# Patient Record
Sex: Female | Born: 2000 | Race: White | Hispanic: No | Marital: Single
Health system: Midwestern US, Community
[De-identification: ages and names within clinical notes are randomized; demographics above are authoritative.]

## PROBLEM LIST (undated history)

## (undated) DIAGNOSIS — N946 Dysmenorrhea, unspecified: Secondary | ICD-10-CM

## (undated) DIAGNOSIS — N912 Amenorrhea, unspecified: Secondary | ICD-10-CM

## (undated) DIAGNOSIS — S43491A Other sprain of right shoulder joint, initial encounter: Secondary | ICD-10-CM

## (undated) HISTORY — PX: TONSILLECTOMY: SUR1361

## (undated) HISTORY — PX: ADENOIDECTOMY: SUR15

## (undated) HISTORY — PX: TYMPANOSTOMY TUBE PLACEMENT: SHX32

---
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
## 2022-02-09 ENCOUNTER — Ambulatory Visit: Admit: 2022-02-09 | Discharge: 2022-02-09 | Payer: PRIVATE HEALTH INSURANCE

## 2022-02-09 DIAGNOSIS — M2602 Maxillary hypoplasia: Secondary | ICD-10-CM

## 2022-02-09 NOTE — Unmapped (Signed)
[facility]  Orthognathic Consult Note    Visit Type:  [facility] NPV ORTHOGNATHIC  Pt. Name: Sharon Christian  Pt. MRN: [id number]  DOB: 13-Nov-2000              Sex: female  Visit Date:  02/09/2022  Provider: Lelon Huh, DDS, MD  Resident: Cornelius Moras, DMD  Location of Care: [facility]    HPI:   No chief complaint on file.      HPI: Sharon Christian is a/an 21 y.o. female referred from Dr. Reola Calkins for evaluation for combined surgical and orthodontic correction of dentofacial deformities.    Orthognathic Surgery Questionnaire  Have you had braces before? Yes  If yes, how long ago, and for how long? Once in elementary school 2-3 years with headgear and another round in middle school for 2-3 years  Do you have braces currently? No  Have you completed your growth? Yes  What is your motivation for orthognathic surgery? improved function, improved esthetics, improved speech, and reduced discomfort  Do you frequently bite your cheeks, lips or tongue? Yes and Details:cheek  Do you have trouble biting into a sandwich? Yes  Do you have inability to incise and chew solid foods? Yes  Do you have trouble pronouncing/enunciating certain sounds? Yes   If yes, what sounds? S, SH, TH   If yes, have you ever been seen by a speech therapist? Yes when younger in elementary school  Do you snore? No  Is your facial deformity a result of a traumatic injury or a previous surgery? No  Do other members of your immediate or distant family have similar facial anomalies?  Yes and Details:paternal  How long have you been aware of the functional impairments? Since elementary school      Past Med/Surg/Family/Social History:  Depression/anxiety    Allergies:  - No known drug allergies  PMH:   - anxiety  - depression  - Possible EDS  PSH:  - R shoulder recon  - T&A  Meds:  Wellbutrin  Fluvoxamine  Minocycline   SH:  - Denies tobacco, EtOH, illicit drug use  FH:  -  Denies family history of bleeding disorders or difficulty with anesthesia    Objective:   Review of Systems: 10-point ROS completed and is negative except noted in HPI.  Vitals:    02/09/22 0938   BP: 120/82   Pulse: 70   Temp: 98.8 F (37.1 C)   SpO2: 99%     Body mass index is 26.16 kg/m.    Physical Exam  Facial exam  Facial shape: oblong  Facial profile: concave-class III     Dental midlines and occlusal cant  Maxillary midline to face: coincident  Mandibular midline to face: coincident  Occlusal plane at canine: parallel to orbital floor    Maxillary incisor position  Maxillary incisors in repose  0 mm  Maxillary incisor show at smile  10 mm  Maxillary central incisor length  10 mm      Gingival display  Gingival display at rest: teeth only  Gingival display at smile: teeth only    Maxillo-mandibular relationship  Occlusal class   Left molar:  Class III  Left canine:  Class III  Right molar:  Class III  Right canine:  Class III  Discrepancies  Open bite: -8 mm  Negative overjet: 13 mm  Transverse crossbite: bilateral     TMJ exam   Pain:  bilateral masseters, temporalis and TMJ capsule  CR-CO mismatch: none  Evidence of bruxism (describe): mild posterior bilateral     Third Molars:   1 - FBI  16 - STI  17 - Erupted  32 - Erupted    Airway  Thyromental distance: > 6 mm  Maximal incisal opening: > 40 mm  Tongue Size: normal  Mallampati Classification: I    Neck: no significant adenopathy, no scars, thyroid normal size  Chest/Respiratory: no gross deformities, no apparent respiratory distress   Cardiovascular: normal rate and rhythm   Neuro: cranial nerves grossly intact, sensation grossly intact, appropriate mental status  Psych: affect and mood appropriate, normal interaction    Radiographic Evaluation/Imaging:  Panorex:   Maxillary sinuses are equal in size and radiodensity. Mandibular condyles are well-formed and seated in the glenoid fossa.  No other radiographic evidence of maxillary or mandibular  pathology.     Cephalogram:   Radiograph reviewed, findings include: Profile - Convex. Cranial bases elevated with sella turcica of normal size and shape. Mandibular plane angle is Steep. Naso-pharyngeal and oro-pharyngeal airway appears to be normal. Please refer to detailed cephalometric analysis done separately for further details.      Assessment/Plan:     ASA Classification: 1    Criss Crall is a/an 21 y.o. female referred from Dr. Reola Calkins for evaluation for combined surgical and orthodontic correction of dentofacial deformities.    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   Discussed with patient/parent(s) the general nature of surgery, expectations of outcome, post operative sequalae, complications and further need for surgery.               [name], DMD  02/09/2022 9:48 AM  [facility]  [facility]  [address], [address]  [place] [address]  Dept: [phone]

## 2022-02-09 NOTE — Unmapped (Signed)
INSTRUCTIONS FOR IV SEDATION PATIENTS    Date of Procedure ________________________      Arrival Time _____________________________        AM Patients; NOTHING to eat or drink AFTER MIDNIGHT the night before your procedure. No food, water, gum, candy or cigarettes.  PM Patients; NOTHING to eat or drink AFTER 6:00AM the morning of your procedure.    Eating or drinking prior to your surgery could cause you to vomit while you are asleep, this could be life threatening.    1. Please REMOVE all jewelry, tongue/lip/nose rings, nail polish and contact lenses.    2. Please wear short sleeves and closed toe shoes, no flip flops.    3. You must have a responsible adult escort with you. They must remain in the building the entire time you are in surgery. Your surgery will be cancelled if your escort is not present during any of your appointment.    4. Your escort should be prepared to take you home by car or cab. You cannot go home on the bus and our facility does not provide transportation. You may park in the associated clinic lot for free. Please bring your ticket to the registration desk and exchange it for a token.    5. Minor patients ages 14-17-years old must be escorted by a parent or legal guardian. If none is present, the procedure will be cancelled. Minor patients ages 13-years old or younger must be escorted by two adults, one of whom is a legal guardian.    6. Children under the age of 14 are not allowed in the clinic area. ([facility] only)    7. If you need to cancel or reschedule, please call [phone].    SPECIAL INSTRUCTIONS:    Medications; Please take all of your normal morning medications with just a sip of water, unless instructed otherwise by your doctor.    If you have Asthma; please bring your inhaler to surgery.    If you have Diabetes; do NOT take your oral medication that morning. If you take insulin, ask the doctor for instructions.    Please limit the amount of people you bring due to limited space  in waiting area.

## 2022-05-13 NOTE — Unmapped (Signed)
Called to confirm details of 2/2 appt with [facility]. No answer, left VM.    Myeshia Fojtik M Narya Beavin, RN  Oral & Maxillofacial Surgery  [facility]

## 2022-05-14 ENCOUNTER — Ambulatory Visit: Admit: 2022-05-14 | Discharge: 2022-05-14 | Payer: Dental

## 2022-05-14 DIAGNOSIS — K006 Disturbances in tooth eruption: Secondary | ICD-10-CM

## 2022-05-14 MED ORDER — fentaNYL (SUBLIMAZE) injection
50 | INTRAMUSCULAR | PRN
Start: 2022-05-14 — End: 2022-05-14
  Administered 2022-05-14: 14:00:00 50 via INTRAVENOUS

## 2022-05-14 MED ORDER — propofol bolus injection
INTRAVENOUS | PRN
Start: 2022-05-14 — End: 2022-05-14
  Administered 2022-05-14 (×3): 20 via INTRAVENOUS

## 2022-05-14 MED ORDER — ibuprofen (MOTRIN) tablet
400 | ORAL | PRN
Start: 2022-05-14 — End: 2022-05-14
  Administered 2022-05-14: 14:00:00 800 via ORAL

## 2022-05-14 MED ORDER — acetaminophen (TYLENOL) 325 MG tablet
325 | ORAL_TABLET | Freq: Four times a day (QID) | ORAL | 0 refills | 11.00000 days | Status: AC | PRN
Start: 2022-05-14 — End: ?

## 2022-05-14 MED ORDER — sodium chloride 0.9% IV bolus
0.9 | INTRAVENOUS | PRN
Start: 2022-05-14 — End: 2022-05-14
  Administered 2022-05-14: 14:00:00 500 via INTRAVENOUS

## 2022-05-14 MED ORDER — ibuprofen (MOTRIN) 800 MG tablet
800 | ORAL_TABLET | Freq: Three times a day (TID) | ORAL | 0 refills | Status: AC | PRN
Start: 2022-05-14 — End: ?

## 2022-05-14 MED ORDER — midazolam (PF) (VERSED) injection
5 | INTRAMUSCULAR | PRN
Start: 2022-05-14 — End: 2022-05-14
  Administered 2022-05-14: 14:00:00 2 via INTRAVENOUS
  Administered 2022-05-14: 14:00:00 5 via INTRAVENOUS

## 2022-05-14 MED ORDER — NON FORMULARY
PRN
Start: 2022-05-14 — End: 2022-05-14
  Administered 2022-05-14: 14:00:00 20

## 2022-05-14 MED ORDER — oxyCODONE (ROXICODONE) 5 MG immediate release tablet
5 | ORAL_TABLET | Freq: Four times a day (QID) | ORAL | 0 refills | 6.00000 days | Status: AC | PRN
Start: 2022-05-14 — End: 2022-05-17

## 2022-05-14 MED ORDER — dexamethasone (DECADRON) injection
4 | INTRAMUSCULAR | PRN
Start: 2022-05-14 — End: 2022-05-14
  Administered 2022-05-14: 14:00:00 8 via INTRAVENOUS

## 2022-05-14 MED ORDER — chlorhexidine (PERIDEX) 0.12 % solution
0.12 | Freq: Two times a day (BID) | 0 refills | 16.00000 days | Status: AC
Start: 2022-05-14 — End: ?

## 2022-05-14 NOTE — Unmapped (Signed)
[facility]   Oral Maxillofacial Surgery      Pt. Name: Sharon Christian  Pt. MRN: [id number]  DOB: 2000/07/10            Sex: female  Provider: Lelon Huh, DDS, MD  Location of Care: [facility] at [facility]    Procedure: Third Molar Extractions #5,40,98,11  Date of Procedure: 05/14/2022     H&P/Consult within 30 days of procedure: No (no changes)    Informed Consent/Counseling Statement:  Plan, alternatives and risks of anesthesia, including death have been explained to and discussed with the patient/legal guardian.  By my assessment, the patient/legal guardian understands and agrees. Scenario presented in detail. Question answered.    Anesthesia: Deep Sedation  The patient presents NPO > 6 hours. Informed consent was performed. Time Out performed. Standard ASA monitors were instituted. Oxygen was delivered via nasal cannula. An IV catheter was placed and fluids began. See Sedation record for details. Reverification.    Gauze throat pack placed in oropharynx, bite block inserted.  Local anesthesia administered: 4 carpules of lidocaine 2% with 1:100k epi.                                                      2 carpules of marcaine 0.5% with 1:200k epi.     Tooth #1: Max Imp 3rd: A standard distobuccal releasing incision was made and a full thickness mucoperiosteal flap was reflected, bone was removed with a periosteal elevator, and the tooth was elevated and removed from its socket with a dental elevator. The flap and socket were suctioned and irrigated. Hemostasis was obtained and the area was packed.  Tooth #16: Simple erupted: A periosteal elevator was used to sever the periodontal fibers. The tooth was elevated and removed from its socket with dental elevators and forceps without complication, the socket was suctioned and irrigated.  Hemostasis was achieved and the area was packed.  Tooth #17 Simple erupted: A periosteal elevator was used to sever the periodontal fibers. The tooth was  elevated and removed from its socket with dental elevators and forceps without complication, the socket was suctioned and irrigated.  Hemostasis was achieved and the area was packed.  Tooth #32 Simple erupted: A periosteal elevator was used to sever the periodontal fibers. The tooth was elevated and removed from its socket with dental elevators and forceps without complication, the socket was suctioned and irrigated.  Hemostasis was achieved and the area was packed.    Thorough suctioning of the oropharynx, removal of throat pack and bite block    Complications/Abnormal findings: N/A  Estimated Blood Loss: Minimal  Patient tolerated anesthesia and procedure well.    Rx:Rx:Oxycodone 5mg  x8 tabs  Ibuprofen 800mg  x 30 tabs  Tylenol 325mg  x 60 tabs  Peridex    Postoperative instructions given both verbally and written. Extra gauze packs given to patient.  Disposition: Patient to follow up on an as needed basis    Signed by: Acey Lav  Date: 05/14/22   Time: 2:42 PM

## 2022-05-14 NOTE — Unmapped (Signed)
Oral and Maxillofacial Surgery  Orthognathic Consult Note    Visit Type:  SEDATION  Pt. Name: Sharon Christian  Pt. MRN: [id number]  DOB: 2000/12/20              Sex: female  Visit Date:  05/14/2022  Provider: Ashley Akin, DDS, MD  Location of Care:  Oral and Maxillofacial Surgery at [facility]    HPI:         HPI: Sharon Christian is a/an 22 y.o. female who returns today for repeat orthognathic evaluation and extraction of 3rd molars. She was initially referred by Dr. Ferdinand Cava but is now seeing Dr. Evalee Mutton. I spoke to Dr. Evalee Mutton and he would like to explore the possibility of surgery first for her as he feels she has a favorable occlusion for this. The patient has a significant class III malocclusion.     Orthognathic Surgery Questionnaire  Have you had braces before? Yes  If yes, how long ago, and for how long? Once in elementary school 2-3 years with headgear and another round in middle school for 2-3 years  Do you have braces currently? No  Have you completed your growth? Yes  What is your motivation for orthognathic surgery? improved function, improved esthetics, improved speech, and reduced discomfort  Do you frequently bite your cheeks, lips or tongue? Yes and Details:cheek  Do you have trouble biting into a sandwich? Yes  Do you have inability to incise and chew solid foods? Yes  Do you have trouble pronouncing/enunciating certain sounds? Yes   If yes, what sounds? S, SH, TH   If yes, have you ever been seen by a speech therapist? Yes when younger in elementary school  Do you snore? No  Is your facial deformity a result of a traumatic injury or a previous surgery? No  Do other members of your immediate or distant family have similar facial anomalies?  Yes and Details:paternal  (dad and grandpa)  How long have you been aware of the functional impairments? Since elementary school      Past Med/Surg/Family/Social History:  Depression/anxiety    Allergies:  - No known drug allergies  PMH:   -  anxiety  - depression  - Possible EDS  PSH:  - R shoulder recon  - T&A  Meds:  Wellbutrin  Fluvoxamine  Minocycline   SH:  - Denies tobacco, EtOH, illicit drug use  FH:  - Denies family history of bleeding disorders or difficulty with anesthesia    Objective:   Review of Systems: 10-point ROS completed and is negative except noted in HPI.  Vitals:    05/14/22 0910 05/14/22 0915   BP: 122/70 115/69   Pulse: 97 81   Resp: 16 14   SpO2:       There is no height or weight on file to calculate BMI.    Physical Exam  Facial exam  Facial shape: oblong  Facial profile: concave-class III     Dental midlines and occlusal cant  Maxillary midline to face: 80mm left  Mandibular midline to face: 67mm left  Occlusal plane at canine: parallel to orbital floor    Maxillary incisor position  Maxillary incisors in repose  0 mm  Maxillary incisor show at smile  10 mm  Maxillary central incisor length  10 mm      Gingival display  Gingival display at rest: teeth only  Gingival display at smile: teeth only    Maxillo-mandibular relationship  Occlusal class  Left molar:  Class III  Left canine:  Class III  Right molar:  Class III  Right canine:  Class III  Discrepancies  Open bite: -6 mm  Negative overjet: 13 mm  Transverse crossbite: bilateral     TMJ exam   Pain: bilateral masseters, temporalis and TMJ capsule  CR-CO mismatch: none  Evidence of bruxism (describe): mild posterior bilateral     Third Molars:   1 - FBI  16 - Erupted  17 - Erupted  32 - Erupted    Airway  Thyromental distance: > 6 mm  Maximal incisal opening: > 40 mm  Tongue Size: normal  Mallampati Classification: I    Neck: no significant adenopathy, no scars, thyroid normal size  Chest/Respiratory: no gross deformities, no apparent respiratory distress   Cardiovascular: normal rate and rhythm   Neuro: cranial nerves grossly intact, sensation grossly intact, appropriate mental status  Psych: affect and mood appropriate, normal interaction    Radiographic  Evaluation/Imaging:  Panorex:   Maxillary sinuses are equal in size and radiodensity. Mandibular condyles are well-formed and seated in the glenoid fossa.  No other radiographic evidence of maxillary or mandibular pathology.     Cephalogram:   Radiograph reviewed, findings include: Profile - Convex. Cranial bases elevated with sella turcica of normal size and shape. Mandibular plane angle is Steep. Naso-pharyngeal and oro-pharyngeal airway appears to be normal. Please refer to detailed cephalometric analysis done separately for further details.      Assessment/Plan:     ASA Classification: 1    Sharon Christian is a/an 22 y.o. female referred from Dr. Evalee Mutton for evaluation for combined surgical and orthodontic correction of dentofacial deformities.    Based on examination of the patient and review of records, the following is my initial assessment and plan:    Primary working diagnosis  Maxillary Hypoplasia    -A-P    -Transverse  Maxillary asymmetry    Mandibular Hyperplasia    -A-P  Mandibular asymmetry        Proposed treatment plan: [place] 1 advancement, BSSO setback, possible reduction genioplasty  3rd molars will be extracted today. Dr. Evalee Mutton will provide models to look at the possibility of surgery first vs. Presurgical orthodontics.      Patient information pamphlet on orthognathic surgery provided.   Discussed with patient/parent(s) the general nature of surgery, expectations of outcome, post operative sequalae, complications and further need for surgery.               Jenell Milliner, DDS, MD  05/14/2022 2:24 PM  [facility]   ORAL AND [facility]ILLOFACIAL SURGERY AT [facility]  [address] [place] [address]  Dept: [phone]
